# Patient Record
Sex: Male | Born: 1935 | Race: White | Hispanic: No | Marital: Married | Smoking: Never smoker
Health system: Southern US, Community
[De-identification: ages and names within clinical notes are randomized; demographics above are authoritative.]

## PROBLEM LIST (undated history)

## (undated) DIAGNOSIS — Z87442 Personal history of urinary calculi: Secondary | ICD-10-CM

## (undated) DIAGNOSIS — E785 Hyperlipidemia, unspecified: Secondary | ICD-10-CM

## (undated) DIAGNOSIS — H409 Unspecified glaucoma: Secondary | ICD-10-CM

## (undated) DIAGNOSIS — N402 Nodular prostate without lower urinary tract symptoms: Secondary | ICD-10-CM

## (undated) DIAGNOSIS — F0150 Vascular dementia without behavioral disturbance: Secondary | ICD-10-CM

## (undated) DIAGNOSIS — N400 Enlarged prostate without lower urinary tract symptoms: Secondary | ICD-10-CM

---

## 2007-09-22 ENCOUNTER — Ambulatory Visit: Payer: Self-pay | Admitting: Internal Medicine

## 2010-10-30 ENCOUNTER — Inpatient Hospital Stay (HOSPITAL_COMMUNITY)
Admission: EM | Admit: 2010-10-30 | Discharge: 2010-11-01 | Payer: Self-pay | Source: Home / Self Care | Attending: Internal Medicine | Admitting: Internal Medicine

## 2010-10-31 LAB — COMPREHENSIVE METABOLIC PANEL
ALT: 23 U/L (ref 0–53)
AST: 48 U/L — ABNORMAL HIGH (ref 0–37)
Albumin: 3.6 g/dL (ref 3.5–5.2)
Alkaline Phosphatase: 81 U/L (ref 39–117)
BUN: 25 mg/dL — ABNORMAL HIGH (ref 6–23)
CO2: 24 mEq/L (ref 19–32)
Calcium: 8.8 mg/dL (ref 8.4–10.5)
Chloride: 104 mEq/L (ref 96–112)
Creatinine, Ser: 1.27 mg/dL (ref 0.4–1.5)
GFR calc Af Amer: >60 mL/min (ref >60)
GFR calc non Af Amer: 54 mL/min — ABNORMAL LOW (ref >60)
Glucose, Bld: 122 mg/dL — ABNORMAL HIGH (ref 70–99)
Potassium: 4 mEq/L (ref 3.5–5.1)
Sodium: 136 mEq/L (ref 135–145)
Total Bilirubin: 0.9 mg/dL (ref 0.3–1.2)
Total Protein: 6.5 g/dL (ref 6.0–8.3)

## 2010-10-31 LAB — DIFFERENTIAL
Basophils Absolute: 0 10*3/uL (ref 0.0–0.1)
Basophils Relative: 0 % (ref 0–1)
Eosinophils Absolute: 0 10*3/uL (ref 0.0–0.7)
Eosinophils Relative: 0 % (ref 0–5)
Lymphocytes Relative: 11 % — ABNORMAL LOW (ref 12–46)
Lymphs Abs: 1.4 10*3/uL (ref 0.7–4.0)
Monocytes Absolute: 0.8 10*3/uL (ref 0.1–1.0)
Monocytes Relative: 6 % (ref 3–12)
Neutro Abs: 10.5 10*3/uL — ABNORMAL HIGH (ref 1.7–7.7)
Neutrophils Relative %: 83 % — ABNORMAL HIGH (ref 43–77)

## 2010-10-31 LAB — MAGNESIUM: Magnesium: 2.1 mg/dL (ref 1.5–2.5)

## 2010-10-31 LAB — CBC
HCT: 39.6 % (ref 39.0–52.0)
Hemoglobin: 13.2 g/dL (ref 13.0–17.0)
MCH: 30.1 pg (ref 26.0–34.0)
MCHC: 33.3 g/dL (ref 30.0–36.0)
MCV: 90.4 fL (ref 78.0–100.0)
Platelets: 189 10*3/uL (ref 150–400)
RBC: 4.38 MIL/uL (ref 4.22–5.81)
RDW: 13.6 % (ref 11.5–15.5)
WBC: 12.7 10*3/uL — ABNORMAL HIGH (ref 4.0–10.5)

## 2010-10-31 LAB — PHOSPHORUS: Phosphorus: 3.2 mg/dL (ref 2.3–4.6)

## 2010-10-31 LAB — MRSA PCR SCREENING: MRSA by PCR: NEGATIVE

## 2010-10-31 LAB — TSH: TSH: 2.147 u[IU]/mL (ref 0.350–4.500)

## 2010-11-01 LAB — CBC
HCT: 36 % — ABNORMAL LOW (ref 39.0–52.0)
Hemoglobin: 11.9 g/dL — ABNORMAL LOW (ref 13.0–17.0)
MCH: 29.8 pg (ref 26.0–34.0)
MCHC: 33.1 g/dL (ref 30.0–36.0)
MCV: 90.2 fL (ref 78.0–100.0)
Platelets: 183 10*3/uL (ref 150–400)
RBC: 3.99 MIL/uL — ABNORMAL LOW (ref 4.22–5.81)
RDW: 13.5 % (ref 11.5–15.5)
WBC: 7.2 10*3/uL (ref 4.0–10.5)

## 2010-11-01 LAB — COMPREHENSIVE METABOLIC PANEL
ALT: 22 U/L (ref 0–53)
AST: 40 U/L — ABNORMAL HIGH (ref 0–37)
Albumin: 2.9 g/dL — ABNORMAL LOW (ref 3.5–5.2)
Alkaline Phosphatase: 74 U/L (ref 39–117)
BUN: 11 mg/dL (ref 6–23)
CO2: 24 mEq/L (ref 19–32)
Calcium: 8.2 mg/dL — ABNORMAL LOW (ref 8.4–10.5)
Chloride: 109 mEq/L (ref 96–112)
Creatinine, Ser: 1.11 mg/dL (ref 0.4–1.5)
GFR calc Af Amer: >60 mL/min (ref >60)
GFR calc non Af Amer: >60 mL/min (ref >60)
Glucose, Bld: 91 mg/dL (ref 70–99)
Potassium: 3.4 mEq/L — ABNORMAL LOW (ref 3.5–5.1)
Sodium: 140 mEq/L (ref 135–145)
Total Bilirubin: 0.8 mg/dL (ref 0.3–1.2)
Total Protein: 5.9 g/dL — ABNORMAL LOW (ref 6.0–8.3)

## 2010-11-01 LAB — GLUCOSE, CAPILLARY
Glucose-Capillary: 116 mg/dL — ABNORMAL HIGH (ref 70–99)
Glucose-Capillary: 99 mg/dL (ref 70–99)
Glucose-Capillary: 139 mg/dL — ABNORMAL HIGH (ref 70–99)

## 2010-11-22 LAB — INFLUENZA PANEL BY PCR (TYPE A & B)
H1N1 flu by pcr: NOT DETECTED
Influenza A By PCR: NEGATIVE
Influenza B By PCR: NEGATIVE

## 2010-11-24 NOTE — H&P (Signed)
NAME:  Jesse Fitzgerald, MOM NO.:  [id number]  MEDICAL RECORD NO.:  [id number]          PATIENT TYPE:  EMS  LOCATION:  [facility]                         FACILITY:  [facility]  PHYSICIAN:  Michiel Cowboy, MDDATE OF BIRTH:  1920/01/23  DATE OF ADMISSION:  10/30/2010 DATE OF DISCHARGE:                             HISTORY & PHYSICAL   PRIMARY CARE DOCTOR:  Dr. Judithann Sheen of [place], [place].  CHIEF COMPLAINT:  Generalized fatigue.  The patient is an 77 year old gentleman with history of hypertension and early dementia and the patient lives with his wife.  Yesterday he was noted to be kind of weak and had a hard time getting out of his chair which is not usual.  His wife called EMS who presented and found him to have normal vital signs.  His symptoms spontaneously improved in a few hours and he was doing better but this morning he had a hard time getting out of bed because he was just so generally weak and not feeling well and altogether throughout the day he was feeling poorly, although could not quite put a finger on it, what exactly was bothering him at which point they brought him to the emergency department.  In ED, first he got a chest x-ray and a CT scan both of which were unremarkable. Then throughout the course of his stay in the ED he became febrile up to 103.6 and his blood pressure started to drop from 120/56 down to 111/70s.  He did appear to be somewhat dehydrated at which point [facility] was called for admission.  The patient denies any chest pain or shortness of breath although he is breathing somewhat rapidly.  His grandson explained to me that he is very anxious and does not like doctors and usually if nobody is in the room he actually behaves much more comfortably.  The patient denies any other complaints.  He denies any pain.  He denies any nausea or vomiting.  He has been having regular bowel movements.  His wife had been sick last week  with maybe a cold and maybe low-grade fever but no high fevers.  Nobody else is ill around him.  He has no pain that he can recall.  Otherwise, review of systems is negative.  PAST MEDICAL HISTORY:  Significant for hypertension and dementia.  FAMILY HISTORY:  Noncontributory.  SOCIAL HISTORY:  The patient does not smoke or drink or abuse drugs.  ALLERGIES:  MAYBE TO STREPTOMYCIN BUT THEY CANNOT TELL FOR SURE.  MEDICATIONS: 1. Aspirin 81 mg daily. 2. Benazepril/hydrochlorothiazide 20/12.5 mg daily. 3. Multivitamins. 4. He takes multiple supplements.  VITALS: Temperature T-max of 103.6, blood pressure 120/56 initially now down to 111/70s, pulse 105, respirations 16, satting 95% on 2 liters. The patient usually not on oxygen.  They could not get a good reading on room air. The patient appears to be in no acute distress. HEAD: Nontraumatic.  Somewhat dryish mucous membranes. LUNGS: Clear breath sounds bilaterally.  No crackles or wheezes could be appreciated. ABDOMEN:Slightly distended but nontender.  Normal bowel sounds present. HEART: Regular rate and rhythm.  Somewhat rapid though I could not  ascertain any murmurs. LOWER EXTREMITIES:Without clubbing, cyanosis or edema. SKIN: Clean, dry and intact.  Somewhat warm and flushed.  LABORATORY DATA:  White blood cell count 16.1, hemoglobin 13.7, sodium 134, potassium 4.4, creatinine 1.47, I am not sure what his baseline is. LFTs within normal limits.  UA unremarkable.  No evidence of infection. Chest x-ray showing very low lung volumes but otherwise unremarkable. CT scan of head unremarkable.  EKG showing right bundle branch block.  I do not have an old EKG on him unfortunately.  ASSESSMENT AND PLAN:  This is an 77 year old male with fever and possible early sepsis.  Will admit to step-down given down trending blood pressures and elevated fever.  At this point the etiology is unclear.  Will put on wide spectrum antibiotics with  vancomycin and Zosyn until we can clarify.  Will make sure we get blood cultures and urine culture.  Give IV fluids.  Follow up lactic acid.  Hypertension.  For now hold benazepril and hydrochlorothiazide.  Right bundle branch block.  Will repeat EKG in the morning.  He is at this point denying any chest pain and has not had any complaints about chest pain.  Elevated creatinine.  The patient does appear to be slightly dehydrated. Will give IV fluids and follow as needed.  May need to be renal ultrasound at this point but hold in case he is obstructed.  Prophylaxis.  Protonix and Lovenox.  Abdominal distention.  Will at least get a KUB for now.  Code status.  At this point family wishes for him to be full code.     Michiel Cowboy, MD     AVD/MEDQ  D:  10/30/2010  T:  10/30/2010  Job:  [id number]  cc:   Dr. Judithann Sheen  Electronically Signed by Therisa Doyne MD on 11/24/2010 06:15:28 AM

## 2011-01-07 LAB — CBC
HCT: 40.7 % (ref 39.0–52.0)
Hemoglobin: 13.7 g/dL (ref 13.0–17.0)
MCH: 30.4 pg (ref 26.0–34.0)
MCHC: 33.7 g/dL (ref 30.0–36.0)
MCV: 90.4 fL (ref 78.0–100.0)
Platelets: 203 10*3/uL (ref 150–400)
RBC: 4.5 MIL/uL (ref 4.22–5.81)
RDW: 13.5 % (ref 11.5–15.5)
WBC: 16.1 10*3/uL — ABNORMAL HIGH (ref 4.0–10.5)

## 2011-01-07 LAB — COMPREHENSIVE METABOLIC PANEL
ALT: 14 U/L (ref 0–53)
AST: 34 U/L (ref 0–37)
Albumin: 4 g/dL (ref 3.5–5.2)
Alkaline Phosphatase: 88 U/L (ref 39–117)
BUN: 28 mg/dL — ABNORMAL HIGH (ref 6–23)
CO2: 24 mEq/L (ref 19–32)
Calcium: 9.3 mg/dL (ref 8.4–10.5)
Chloride: 99 mEq/L (ref 96–112)
Creatinine, Ser: 1.47 mg/dL (ref 0.4–1.5)
GFR calc Af Amer: 55 mL/min — ABNORMAL LOW (ref >60)
GFR calc non Af Amer: 45 mL/min — ABNORMAL LOW (ref >60)
Glucose, Bld: 102 mg/dL — ABNORMAL HIGH (ref 70–99)
Potassium: 4.4 mEq/L (ref 3.5–5.1)
Sodium: 134 mEq/L — ABNORMAL LOW (ref 135–145)
Total Bilirubin: 0.7 mg/dL (ref 0.3–1.2)
Total Protein: 7.4 g/dL (ref 6.0–8.3)

## 2011-01-07 LAB — URINE CULTURE
Colony Count: NO GROWTH
Culture  Setup Time: 201201032210
Culture: NO GROWTH

## 2011-01-07 LAB — CULTURE, BLOOD (ROUTINE X 2)
Culture  Setup Time: 201201040559
Culture  Setup Time: 201201040559
Culture: NO GROWTH
Culture: NO GROWTH

## 2011-01-07 LAB — DIFFERENTIAL
Lymphs Abs: 1.9 10*3/uL (ref 0.7–4.0)
Monocytes Absolute: 1.2 10*3/uL — ABNORMAL HIGH (ref 0.1–1.0)
Monocytes Relative: 7 % (ref 3–12)
Neutro Abs: 13 10*3/uL — ABNORMAL HIGH (ref 1.7–7.7)
Neutrophils Relative %: 81 % — ABNORMAL HIGH (ref 43–77)

## 2011-01-07 LAB — URINALYSIS, ROUTINE W REFLEX MICROSCOPIC
Bilirubin Urine: NEGATIVE
Glucose, UA: NEGATIVE mg/dL
Hgb urine dipstick: NEGATIVE
Ketones, ur: NEGATIVE mg/dL
Nitrite: NEGATIVE
Protein, ur: NEGATIVE mg/dL
Specific Gravity, Urine: 1.023 (ref 1.005–1.030)
Urobilinogen, UA: 0.2 mg/dL (ref 0.0–1.0)
pH: 5.5 (ref 5.0–8.0)

## 2011-01-07 LAB — URINE MICROSCOPIC-ADD ON

## 2016-12-23 ENCOUNTER — Emergency Department: Payer: Medicare Other

## 2016-12-23 ENCOUNTER — Emergency Department
Admission: EM | Admit: 2016-12-23 | Discharge: 2016-12-26 | Disposition: E | Payer: Medicare Other | Attending: Emergency Medicine | Admitting: Emergency Medicine

## 2016-12-23 ENCOUNTER — Encounter: Payer: Self-pay | Admitting: Emergency Medicine

## 2016-12-23 DIAGNOSIS — A419 Sepsis, unspecified organism: Secondary | ICD-10-CM

## 2016-12-23 DIAGNOSIS — I214 Non-ST elevation (NSTEMI) myocardial infarction: Secondary | ICD-10-CM | POA: Diagnosis not present

## 2016-12-23 DIAGNOSIS — N390 Urinary tract infection, site not specified: Secondary | ICD-10-CM | POA: Insufficient documentation

## 2016-12-23 DIAGNOSIS — J189 Pneumonia, unspecified organism: Secondary | ICD-10-CM | POA: Insufficient documentation

## 2016-12-23 DIAGNOSIS — R740 Nonspecific elevation of levels of transaminase and lactic acid dehydrogenase [LDH]: Secondary | ICD-10-CM | POA: Insufficient documentation

## 2016-12-23 DIAGNOSIS — J9601 Acute respiratory failure with hypoxia: Secondary | ICD-10-CM | POA: Insufficient documentation

## 2016-12-23 DIAGNOSIS — R7989 Other specified abnormal findings of blood chemistry: Secondary | ICD-10-CM

## 2016-12-23 DIAGNOSIS — R319 Hematuria, unspecified: Secondary | ICD-10-CM

## 2016-12-23 DIAGNOSIS — Z79899 Other long term (current) drug therapy: Secondary | ICD-10-CM | POA: Diagnosis not present

## 2016-12-23 DIAGNOSIS — R0602 Shortness of breath: Secondary | ICD-10-CM | POA: Diagnosis present

## 2016-12-23 HISTORY — DX: Benign prostatic hyperplasia without lower urinary tract symptoms: N40.0

## 2016-12-23 HISTORY — DX: Unspecified glaucoma: H40.9

## 2016-12-23 HISTORY — DX: Vascular dementia, unspecified severity, without behavioral disturbance, psychotic disturbance, mood disturbance, and anxiety: F01.50

## 2016-12-23 HISTORY — DX: Nodular prostate without lower urinary tract symptoms: N40.2

## 2016-12-23 HISTORY — DX: Personal history of urinary calculi: Z87.442

## 2016-12-23 HISTORY — DX: Hyperlipidemia, unspecified: E78.5

## 2016-12-23 LAB — CBC WITH DIFFERENTIAL/PLATELET
BASOS ABS: 0 10*3/uL (ref 0–0.1)
BASOS PCT: 0 %
EOS PCT: 0 %
Eosinophils Absolute: 0 10*3/uL (ref 0–0.7)
HEMATOCRIT: 42.5 % (ref 40.0–52.0)
Hemoglobin: 14.3 g/dL (ref 13.0–18.0)
Lymphocytes Relative: 15 %
Lymphs Abs: 2.3 10*3/uL (ref 1.0–3.6)
MCH: 30.9 pg (ref 26.0–34.0)
MCHC: 33.7 g/dL (ref 32.0–36.0)
MCV: 91.8 fL (ref 80.0–100.0)
MONO ABS: 1.5 10*3/uL — AB (ref 0.2–1.0)
Monocytes Relative: 10 %
NEUTROS ABS: 11.3 10*3/uL — AB (ref 1.4–6.5)
Neutrophils Relative %: 75 %
PLATELETS: 278 10*3/uL (ref 150–440)
RBC: 4.63 MIL/uL (ref 4.40–5.90)
RDW: 14.1 % (ref 11.5–14.5)
WBC: 15.1 10*3/uL — ABNORMAL HIGH (ref 3.8–10.6)

## 2016-12-23 LAB — COMPREHENSIVE METABOLIC PANEL
ALK PHOS: 98 U/L (ref 38–126)
ALT: 92 U/L — ABNORMAL HIGH (ref 17–63)
ANION GAP: 15 (ref 5–15)
AST: 131 U/L — ABNORMAL HIGH (ref 15–41)
Albumin: 3.9 g/dL (ref 3.5–5.0)
BUN: 46 mg/dL — ABNORMAL HIGH (ref 6–20)
CALCIUM: 9.3 mg/dL (ref 8.9–10.3)
CHLORIDE: 103 mmol/L (ref 101–111)
CO2: 19 mmol/L — AB (ref 22–32)
Creatinine, Ser: 1.54 mg/dL — ABNORMAL HIGH (ref 0.61–1.24)
GFR calc non Af Amer: 37 mL/min — ABNORMAL LOW (ref >60)
GFR, EST AFRICAN AMERICAN: 43 mL/min — AB (ref >60)
Glucose, Bld: 124 mg/dL — ABNORMAL HIGH (ref 65–99)
Potassium: 3.7 mmol/L (ref 3.5–5.1)
SODIUM: 137 mmol/L (ref 135–145)
Total Bilirubin: 1.7 mg/dL — ABNORMAL HIGH (ref 0.3–1.2)
Total Protein: 7.7 g/dL (ref 6.5–8.1)

## 2016-12-23 LAB — URINALYSIS, COMPLETE (UACMP) WITH MICROSCOPIC
Bacteria, UA: NONE SEEN
Bilirubin Urine: NEGATIVE
Glucose, UA: NEGATIVE mg/dL
Ketones, ur: NEGATIVE mg/dL
Nitrite: NEGATIVE
Protein, ur: 100 mg/dL — AB
SPECIFIC GRAVITY, URINE: 1.023 (ref 1.005–1.030)
pH: 5 (ref 5.0–8.0)

## 2016-12-23 LAB — BLOOD GAS, ARTERIAL
ACID-BASE DEFICIT: 5.6 mmol/L — AB (ref 0.0–2.0)
ALLENS TEST (PASS/FAIL): POSITIVE — AB
BICARBONATE: 15.2 mmol/L — AB (ref 20.0–28.0)
Delivery systems: POSITIVE
Expiratory PAP: 5
FIO2: 0.4
Inspiratory PAP: 10
Mechanical Rate: 8
O2 SAT: 99 %
PATIENT TEMPERATURE: 37
PO2 ART: 123 mmHg — AB (ref 83.0–108.0)
pCO2 arterial: 20 mmHg — ABNORMAL LOW (ref 32.0–48.0)
pH, Arterial: 7.49 — ABNORMAL HIGH (ref 7.350–7.450)

## 2016-12-23 LAB — PROCALCITONIN: PROCALCITONIN: 0.59 ng/mL

## 2016-12-23 LAB — BRAIN NATRIURETIC PEPTIDE: B NATRIURETIC PEPTIDE 5: 3436 pg/mL — AB (ref 0.0–100.0)

## 2016-12-23 LAB — PROTIME-INR
INR: 1.2
PROTHROMBIN TIME: 15.3 s — AB (ref 11.4–15.2)

## 2016-12-23 LAB — LACTIC ACID, PLASMA: LACTIC ACID, VENOUS: 4.4 mmol/L — AB (ref 0.5–1.9)

## 2016-12-23 LAB — TROPONIN I: TROPONIN I: 19.31 ng/mL — AB (ref <0.03)

## 2016-12-23 LAB — LIPASE, BLOOD: Lipase: 20 U/L (ref 11–51)

## 2016-12-23 MED ORDER — DEXTROSE 5 % IV SOLN
2.0000 g | INTRAVENOUS | Status: DC
  Filled 2016-12-23: qty 2

## 2016-12-23 MED ORDER — DEXTROSE 5 % IV SOLN
500.0000 mg | Freq: Once | INTRAVENOUS | Status: AC
  Administered 2016-12-23: 500 mg via INTRAVENOUS
  Filled 2016-12-23: qty 500

## 2016-12-23 MED ORDER — DEXTROSE 5 % IV SOLN: 1.0000 g | Freq: Once | INTRAVENOUS | Status: DC

## 2016-12-23 MED ORDER — SODIUM CHLORIDE 0.9 % IV BOLUS (SEPSIS)
1000.0000 mL | Freq: Once | INTRAVENOUS | Status: AC
  Administered 2016-12-23: 1000 mL via INTRAVENOUS

## 2016-12-23 MED ORDER — HEPARIN SODIUM (PORCINE) 5000 UNIT/ML IJ SOLN
4000.0000 [IU] | Freq: Once | INTRAMUSCULAR | Status: AC
  Administered 2016-12-23: 4000 [IU] via INTRAVENOUS
  Filled 2016-12-23: qty 1

## 2016-12-23 MED ORDER — AZITHROMYCIN 500 MG IV SOLR: 500.0000 mg | INTRAVENOUS | Status: DC

## 2016-12-23 MED ORDER — ASPIRIN 81 MG PO CHEW: 324.0000 mg | CHEWABLE_TABLET | Freq: Once | ORAL | Status: DC

## 2016-12-23 MED ORDER — HEPARIN (PORCINE) IN NACL 100-0.45 UNIT/ML-% IJ SOLN
900.0000 [IU]/h | INTRAMUSCULAR | Status: DC
  Administered 2016-12-23: 900 [IU]/h via INTRAVENOUS
  Filled 2016-12-23: qty 250

## 2016-12-23 MED ORDER — SODIUM CHLORIDE 0.9 % IV BOLUS (SEPSIS): 250.0000 mL | Freq: Once | INTRAVENOUS | Status: DC

## 2016-12-23 MED ORDER — CEFTRIAXONE SODIUM-DEXTROSE 1-3.74 GM-% IV SOLR
1.0000 g | Freq: Once | INTRAVENOUS | Status: AC
  Administered 2016-12-23: 1 g via INTRAVENOUS
  Filled 2016-12-23: qty 50

## 2016-12-23 MED ORDER — NOREPINEPHRINE BITARTRATE 1 MG/ML IV SOLN
0.0000 ug/min | Freq: Once | INTRAVENOUS | Status: AC
  Administered 2016-12-23: 5 ug/min via INTRAVENOUS
  Filled 2016-12-23: qty 4

## 2016-12-23 MED ORDER — SODIUM CHLORIDE 0.9 % IV BOLUS (SEPSIS): 1000.0000 mL | Freq: Once | INTRAVENOUS | Status: DC

## 2016-12-23 MED ORDER — ASPIRIN 300 MG RE SUPP
300.0000 mg | Freq: Once | RECTAL | Status: AC
  Administered 2016-12-23: 300 mg via RECTAL
  Filled 2016-12-23: qty 1

## 2016-12-23 MED FILL — Medication: Qty: 1 | Status: AC

## 2016-12-24 LAB — URINE CULTURE: Culture: NO GROWTH

## 2016-12-26 DIAGNOSIS — 419620001 Death: Secondary | SNOMED CT | POA: Insufficient documentation

## 2016-12-26 NOTE — ED Notes (Signed)
Family to nursing station, stating that they were leaving.

## 2016-12-26 NOTE — ED Provider Notes (Signed)
[facility] Emergency Department Provider Note  ____________________________________________   First MD Initiated Contact with Patient 12-27-2016 1240     (approximate)  I have reviewed the triage vital signs and the nursing notes.   HISTORY  Chief Complaint Shortness of Breath  Level 5 caveat:  history/ROS limited by acute/critical illness  HPI Jesse Fitzgerald is a 83 y.o. male with no history of lung disease documented in the EMR (PCP is Dr. Judithann Sheen) who presents by EMS in severe respiratory distress.   History was limited, but apparently he has been having increased shortness of breath over the last few days or possibly a week.  First responders to the 911 call told paramedics that his oxygen saturation was in the low 80s and they started him on a nonrebreather.  In route to the hospital he received 3 DuoNeb labs and Solu-Medrol 125 mg IV and the patient states he is breathing better, but he has tachypnea in the 50s and is using accessory muscles upon arrival.  He is alert and responsive to my questions.  He denies chest pain.  He also denies feeling bad although he does admit that he has some respiratory difficulties.  Past Medical History:  Diagnosis Date  . BPH (benign prostatic hyperplasia)   . Glaucoma   . History of nephrolithiasis   . Hyperlipidemia   . Prostate nodule   . Vascular dementia     There are no active problems to display for this patient.   History reviewed. No pertinent surgical history.  Prior to Admission medications   Medication Sig Start Date End Date Taking? Authorizing Provider  Cetirizine HCl (ZYRTEC ALLERGY PO) Take 1 tablet by mouth daily.   Yes Historical Provider, MD  guaiFENesin (MUCINEX) 600 MG 12 hr tablet Take 600 mg by mouth daily.   Yes Historical Provider, MD  Multiple Vitamin (MULTIVITAMIN) tablet Take 1 tablet by mouth daily.   Yes Historical Provider, MD    Allergies Patient has no known  allergies.  Family History  Problem Relation Age of Onset  . Family history unknown: Yes    Social History Social History  Substance Use Topics  . Smoking status: Never Smoker  . Smokeless tobacco: Never Used  . Alcohol use No    Review of Systems Level 5 caveat:  history/ROS limited by acute/critical illness as well as chronic vascular dementia  ____________________________________________   PHYSICAL EXAM:  ED Triage Vitals  Enc Vitals Group     BP 12-27-16 1248 99/70     Pulse Rate 12/27/2016 1248 (!) 120     Resp Dec 27, 2016 1248 (!) 38     Temp Dec 27, 2016 1248 97 F (36.1 C)     Temp Source 12-27-2016 1248 Oral     SpO2 2016/12/27 1248 (!) 70 %     Weight 12/27/2016 1249 160 lb (72.6 kg)     Height 12/27/2016 1249 5\' 6"  (1.676 m)     Head Circumference --      Peak Flow --      Pain Score --      Pain Loc --      Pain Edu? --      Excl. in GC? --      Constitutional: Alert, in severe respiratory distress with accessory muscle usage and tachypnea Eyes: Conjunctivae are normal. PERRL. EOMI. Head: Atraumatic. Nose: No congestion/rhinnorhea. Mouth/Throat: Mucous membranes are moist. Neck: No stridor.  No meningeal signs.   Cardiovascular: Sinus tachycardia, regular rhythm. Grossly normal  heart sounds. Respiratory: Increased respiratory effort with retractions and intercostal muscle usage.  Coarse breath sounds are present bilaterally but most notable on the right.  There is some wheezing but it is minimal in the expiratory phase. Gastrointestinal: Soft and nontender. No distention.  Musculoskeletal: No lower extremity tenderness nor edema. No gross deformities of extremities. Neurologic:  Normal speech and language. No gross focal neurologic deficits are appreciated.  Skin:  Skin is cool, clammy and intact. No rash noted.   ____________________________________________   LABS (all labs ordered are listed, but only abnormal results are displayed)  Labs Reviewed  BLOOD  GAS, ARTERIAL - Abnormal; Notable for the following:       Result Value   pH, Arterial 7.49 (*)    pCO2 arterial 20 (*)    pO2, Arterial 123 (*)    Bicarbonate 15.2 (*)    Acid-base deficit 5.6 (*)    Allens test (pass/fail) POSITIVE (*)    All other components within normal limits  LACTIC ACID, PLASMA - Abnormal; Notable for the following:    Lactic Acid, Venous 4.4 (*)    All other components within normal limits  COMPREHENSIVE METABOLIC PANEL - Abnormal; Notable for the following:    CO2 19 (*)    Glucose, Bld 124 (*)    BUN 46 (*)    Creatinine, Ser 1.54 (*)    AST 131 (*)    ALT 92 (*)    Total Bilirubin 1.7 (*)    GFR calc non Af Amer 37 (*)    GFR calc Af Amer 43 (*)    All other components within normal limits  BRAIN NATRIURETIC PEPTIDE - Abnormal; Notable for the following:    B Natriuretic Peptide 3,436.0 (*)    All other components within normal limits  TROPONIN I - Abnormal; Notable for the following:    Troponin I 19.31 (*)    All other components within normal limits  CBC WITH DIFFERENTIAL/PLATELET - Abnormal; Notable for the following:    WBC 15.1 (*)    Neutro Abs 11.3 (*)    Monocytes Absolute 1.5 (*)    All other components within normal limits  PROTIME-INR - Abnormal; Notable for the following:    Prothrombin Time 15.3 (*)    All other components within normal limits  URINALYSIS, COMPLETE (UACMP) WITH MICROSCOPIC - Abnormal; Notable for the following:    Color, Urine AMBER (*)    APPearance HAZY (*)    Hgb urine dipstick MODERATE (*)    Protein, ur 100 (*)    Leukocytes, UA LARGE (*)    Squamous Epithelial / LPF 0-5 (*)    All other components within normal limits  CULTURE, BLOOD (ROUTINE X 2)  CULTURE, BLOOD (ROUTINE X 2)  URINE CULTURE  LIPASE, BLOOD  PROCALCITONIN  LACTIC ACID, PLASMA  INFLUENZA PANEL BY PCR (TYPE A & B)  HEPARIN LEVEL (UNFRACTIONATED)  APTT   ____________________________________________  EKG  ED ECG REPORT I, Anaiyah Anglemyer,  Aasha Dina, the attending physician, personally viewed and interpreted this ECG.  Date: 12/27/2016 EKG Time: 12:51 PM Rate: 127 Rhythm: sinus tachycardia QRS Axis: normal Intervals: RBBB and LPFB ST/T Wave abnormalities: Profound ST segment changes most notable in leads 1, 3, aVF, aVR, V2, V3. Conduction Disturbances: none Narrative Interpretation: Does not meet STEMI criteria but likely represents acute ischemia in the setting of severe respiratory distress/failure  ____________________________________________  RADIOLOGY   Dg Chest Port 1 View  Result Date: 12/27/2016 CLINICAL DATA:  Respiratory failure,  difficulty breathing, shortness of Breath EXAM: PORTABLE CHEST 1 VIEW COMPARISON:  10/31/2010 FINDINGS: Diffuse airspace disease throughout the right lung, concerning for pneumonia. Left base atelectasis or infiltrate. Elevation of the left hemidiaphragm. Heart is normal size. Probable right effusion. IMPRESSION: Diffuse right lung airspace disease and left basilar opacity concerning for pneumonia. Small right effusion. Electronically Signed   By: Charlett Nose M.D.   On: 01/03/2017 13:23    ____________________________________________   PROCEDURES  Procedure(s) performed:   .Critical Care Performed by: Loleta Rose Authorized by: Loleta Rose   Critical care provider statement:    Critical care time (minutes):  75   Critical care time was exclusive of:  Separately billable procedures and treating other patients   Critical care was necessary to treat or prevent imminent or life-threatening deterioration of the following conditions:  Respiratory failure   Critical care was time spent personally by me on the following activities:  Development of treatment plan with patient or surrogate, discussions with consultants, evaluation of patient's response to treatment, examination of patient, obtaining history from patient or surrogate, ordering and performing treatments and interventions,  ordering and review of laboratory studies, ordering and review of radiographic studies, pulse oximetry, re-evaluation of patient's condition and review of old charts  Procedure Name: Intubation Date/Time: 2017/01/03 3:10 PM Performed by: Loleta Rose Pre-anesthesia Checklist: Emergency Drugs available Oxygen Delivery Method: Ambu bag Preoxygenation: Pre-oxygenation with 100% oxygen Ventilation: Mask ventilation without difficulty Laryngoscope Size: Glidescope and 4 Tube size: 7.5 mm Number of attempts: 1 Placement Confirmation: ETT inserted through vocal cords under direct vision,  CO2 detector and Breath sounds checked- equal and bilateral Secured at: 24 cm Tube secured with: Tape Dental Injury: Teeth and Oropharynx as per pre-operative assessment         Critical Care performed: Yes, see critical care procedure note(s) ____________________________________________   INITIAL IMPRESSION / ASSESSMENT AND PLAN / ED COURSE  Pertinent labs & imaging results that were available during my care of the patient were reviewed by me and considered in my medical decision making (see chart for details).  The patient appears critically ill upon arrival although it appears initially at least to be an issue of respiratory failure rather than sepsis or cardiac.  I am ordering all of the sepsis protocol but not calling a code sepsis at this point as he is not febrile and does not have tachycardia.  I have called respiratory therapy to put him on BiPAP and obtain an ABG.  He was on nonrebreather upon arrival and we took him off oxygen to transfer to the ED stretcher.  During that time we got a brief period of a good waveform on the pulse oximeter and he was at 70%.  I started him on NRB to transition to Bipap.  Family is going to arrive soon and hopefully should provide additional history.  Given possibility of  heart failure with the coarse breath sounds, will hope on IV fluids at this time.     Clinical Course as of Dec 23 1509  Mon Jan 03, 2017  1257 EKG highly concerning for acute ischemia but given the history of several days of worsening respiratory distress I suspect this is demand ischemia rather than a primary cardiac event.  The patient continues to have no chest pain and he is not currently stable for the cardiac catheterization lab.  We will treat his respiratory distress and obtain the rest of the workup first and I will obtain collateral information from the  family when they arrive.  [CF]  24 Spoke with daughter and son-in-law.  They confirm that the patient is generally quite healthy for his age and has had no history of cardiac or lung issues.  They confirmed that he has been increasingly short of breath particularly with exertion over the last 5 days.  His symptoms were very severe today so his daughter insisted that EMS was called.  Reportedly EMS was called at least once over the last couple of days but either the patient declined transport or, according to the daughter, the family was told that he did not need to be transported to the hospital.  Regardless he does appear acutely worse to them today.  We had a lengthy conversation about goals of care and the patient is full code and we agreed that if he does not improve or if he worsens on the BiPAP then we will proceed with intubation, CPR, and whatever else is necessary.  Patient remains alert, has tachypnea in the 40s, and his pulse oximeter is difficult to appreciate but ranges from the 80s to the 90s.  His ABG is surprisingly reassuring, most notable for a low PCO2 secondary to his tachypnea.  [CF]  1336 I reviewed the chest x-ray as well as the radiology report and the right lung strongly suggest pneumonia.  I have made the patient a code sepsis and ordered community acquired pneumonia antibiotics (ceftriaxone and azithromycin) as well as 30 mL/kg of IV fluids but we will give him an sequence given his age and my concern  for volume overload.  He is not hypertensive at this time.  Lactate is still pending.  I ordered an influenza PCR panel.  I updated the patient's daughter.  He remains alert but is still breathing 40-50 times a minute although he does look slightly more comfortable on BiPAP and he looked before.  His oxygen saturation seems to be about 96% at this time.  [CF]  1346 Lactate 4.4, Troponin >19.  Ordering heparin bolus and infusion, aspirin 300 mg PR, calling Dr. Kirke Corin who is on call in the cath lab to discuss.  Patient remains unstable.  Will update family after I speak with Dr. Kirke Corin.  [CF]  1355 Family stepped out, so I cannot update them yet.  Awaiting callback from Dr. Kirke Corin.  [CF]  1401 I spoke by phone with Dr. Kirke Corin who reviewed the EKG and agrees the patient likely had a cardiac event several days ago, but he does not meet STEMI criteria at this time.  He agrees with my plan for heparin and ASA 300 PR, and feels the patient needs to be stabilized prior to any cardiac intervention.  [CF]  1417 I updated the patient's daughter with the grave prognosis and multiple comorbid and critical conditions.  I explained that he likely will need to be intubated and the pros and cons associated with intubation vs continued bipap.  They have multiple family members on the way and she does not want to proceed with intubation unless it is absolutely necessary so that the family members could have a chance to talk with him.  I reassessed him and he says that he is feeling better, remains alert, and says that his breathing is more comfortable.  He still has tachypnea but it has improved.  The patient's daughter understands the situation and knows that I may need to proceed with emergent intubation but at this point we will hold off and I have paged the ICU to  discuss admission and management plans.  [CF]  1505 The patient had an acute event with his family present.  He went from talking to them too suddenly staring off in  space and was unresponsive.  I was called emergently to the room and when I ran in his pupils are were both equal and dilated and he was breathing but not responding to noxious stimuli including corneal reflex.  We immediately began to support his breathing, which was slowing rapidly, with BVM respirations and as we were setting up for intubation he lost a pulse.  He continued PEA arrest for 2 rounds of CPR and then we got back a pulse.  He was intubated with equal breath sounds bilaterally and color change during the initial CPR.  He lost pulses again but once again after brief PEA arrest we were able to get a spontaneous return of pulse.  I left the room at that point update the family.  I explained the grave prognosis and they confirmed that if he were to code again they would be comfortable with me not continuing CPR and calling the code.  When I return to the room he had a pulse and I started him on Levophed, but he began to brady down and then again lost a pulse.  At this point I called it due to repeated PEA arrest.  I updated the family after initially calling the death and they confirmed that they do not want to make the case a medical examiner case which I believe is appropriate given the lab results and the probability he had a cardiac event within the last 5 days.  We are preparing the body for viewing by the family and they have been updated all their questions answered.  [CF]    Clinical Course User Index [CF] Loleta Roseory Spencer Cardinal, MD    ____________________________________________  FINAL CLINICAL IMPRESSION(S) / ED DIAGNOSES  Final diagnoses:  Acute respiratory failure with hypoxemia (HCC)  NSTEMI (non-ST elevated myocardial infarction) (HCC)  Community acquired pneumonia of right lung, unspecified part of lung  Urinary tract infection with hematuria, site unspecified  Elevated lactic acid level  Sepsis, due to unspecified organism [facility](HCC)     MEDICATIONS GIVEN DURING THIS  VISIT:  Medications  sodium chloride 0.9 % bolus 1,000 mL (1,000 mLs Intravenous New Bag/Given 02/21/17 1351)    And  sodium chloride 0.9 % bolus 1,000 mL (not administered)    And  sodium chloride 0.9 % bolus 250 mL (not administered)  heparin ADULT infusion 100 units/mL (25000 units/22850mL sodium chloride 0.45%) (900 Units/hr Intravenous New Bag/Given 02/21/17 1433)  azithromycin (ZITHROMAX) 500 mg in dextrose 5 % 250 mL IVPB (not administered)  cefTRIAXone (ROCEPHIN) 2 g in dextrose 5 % 50 mL IVPB (not administered)  azithromycin (ZITHROMAX) 500 mg in dextrose 5 % 250 mL IVPB (500 mg Intravenous New Bag/Given 02/21/17 1402)  cefTRIAXone (ROCEPHIN) IVPB 1 g (1 g Intravenous Given 02/21/17 1351)  heparin injection 4,000 Units (4,000 Units Intravenous Given 02/21/17 1414)  aspirin suppository 300 mg (300 mg Rectal Given 02/21/17 1350)  norepinephrine (LEVOPHED) 4 mg in dextrose 5 % 250 mL (0.016 mg/mL) infusion (5 mcg/min Intravenous New Bag/Given 02/21/17 1456)     NEW OUTPATIENT MEDICATIONS STARTED DURING THIS VISIT:  New Prescriptions   No medications on file    Modified Medications   No medications on file    Discontinued Medications   No medications on file     Note:  This document was prepared  using Conservation officer, historic buildings and may include unintentional dictation errors.    Loleta Rose, MD 01/19/2017 [phone]

## 2016-12-26 NOTE — ED Notes (Signed)
Pts family given condolence tray

## 2016-12-26 NOTE — ED Notes (Signed)
Pts family and pts pastor in room, denies needs at this time

## 2016-12-26 NOTE — ED Notes (Signed)
Pt became unresponsive to verbal cues, MD made aware. Staff and MD reported to bedside promptly.

## 2016-12-26 NOTE — Progress Notes (Signed)
[place] responded to a page for the Pt to provide support for the family of a Pt who passed away in the Ed. Pt was alert when he arrived in the Ed, but his health rapidly deteriorated that he became unresponsive and then died. [place] met the family, provided the ministry of encourage and presence until the Pt's family pastor arrived. [place] informed the family that the [facility] was available if needed.    01/04/2017 1500  Clinical Encounter Type  Visited With Patient;Patient and family together  Visit Type Initial;Death  Referral From Nurse  Consult/Referral To Chaplain  Spiritual Encounters  Spiritual Needs Emotional;Grief support;Other (Comment)

## 2016-12-26 NOTE — Progress Notes (Signed)
ANTICOAGULATION CONSULT NOTE - Initial Consult  Pharmacy Consult for heparin dosing Indication: chest pain/ACS  No Known Allergies  Patient Measurements: Height: $RemoveBeforeD [id number]$5\' 6"lated) : 63.8 Heparin Dosing Weight: 72.6 kg  Vital Signs: Temp: 97 F (36.1 C) (02/26 1248) Temp Source: Oral (02/26 1248) BP: 99/70 (02/26 1248) Pulse Rate: 120 (02/26 1248)  Labs:  Recent Labs  Jul 01, 2017 1252  HGB 14.3  HCT 42.5  PLT 278  LABPROT 15.3*  INR 1.20  CREATININE 1.54*  TROPONINI 19.31*    Estimated Creatinine Clearance: 26.5 mL/min (by C-G formula based on SCr of 1.54 mg/dL (H)).   Medical History: Past Medical History:  Diagnosis Date  . BPH (benign prostatic hyperplasia)   . Glaucoma   . History of nephrolithiasis   . Hyperlipidemia   . Prostate nodule   . Vascular dementia     Medications:  Scheduled:  . cefTRIAXone  1 g Intravenous Once  . heparin  4,000 Units Intravenous Once    Assessment: Patient admitted for SOB and DOE w/ tachypnea. Patient was found to have trop of 19.3 Pharmacy has been consulted for heparin dosing.  Goal of Therapy:  Heparin level 0.3-0.7 units/ml Monitor platelets by anticoagulation protocol: Yes   Plan:  Patient was given heparin 4000 unit bolus x 1 in ED @ 1400. Will order stat aPTT, protime/INR already drawn. Will initiate heparin drip to start at 900 unit/hr (12 unit/kg/hr). Will check Anti-Xa 8 hours after start of infusion 2/26 @ 2200. Will continue to monitor H/H and plts.  Thank you for this consult.  Thomasene Rippleavid Deborahann Poteat, PharmD, BCPS Clinical Pharmacist October 29, 2016

## 2016-12-26 NOTE — ED Notes (Signed)
Orderly at bedside

## 2016-12-26 NOTE — ED Triage Notes (Signed)
Pt to ED from home c/o SOB. Per EMS pt with dyspnea upon exertion and at rest, tachypnic at the scene, EMS administered 125 mg of soulmedrol and 3 duonebs while in route.

## 2016-12-26 NOTE — ED Notes (Addendum)
Pulse not palpable at 1440 CPR started. 1st dose of epinephrine 1mg   administered at 1441, pulse back at 1443, CPR paused. Succinylcholine 100 mg administered at 1444, pt intubated 24 at the teeth. Pulse not palpable at 1444, cpr resumed. 2nd dose of epinephrine administered at 1445. Pulse palpable at 1448.

## 2016-12-26 NOTE — ED Notes (Signed)
At 1456 pulse not palpable. MD spoke to family, and they decided not to continue with resuscitative measures. Time of death [id number].

## 2016-12-26 NOTE — Progress Notes (Signed)
Pharmacy Antibiotic Note  Jesse Fitzgerald is a 83 y.o. male admitted on 2017-08-14 with pneumonia.  Pharmacy has been consulted for azithromycin/ceftriaxone dosing.  Plan: Patient was given Azithromycin 500 mg x 1 IV and Rocephin 1g x 1 IV in the ED.  Will start Azithromycin 500 mg daily and Ceftriaxone 2g daily starting tomorrow 2/27. Patient meets 3/4 SIRs criteria and was found to have basilar opacities on CXR. Will monitor resolution of signs/symptoms of infection.  Height: 5\' 6"  (167.6 cm) Weight: 160 lb (72.6 kg) IBW/kg (Calculated) : 63.8  Temp (24hrs), Avg:97 F (36.1 C), Min:97 F (36.1 C), Max:97 F (36.1 C)   Recent Labs Lab 2017/07/18 1252  WBC 15.1*  CREATININE 1.54*  LATICACIDVEN 4.4*    Estimated Creatinine Clearance: 26.5 mL/min (by C-G formula based on SCr of 1.54 mg/dL (H)).    No Known Allergies  Antimicrobials this admission: 2/26 Azithro >>  2/26 CTX >>   Dose adjustments this admission: CTX 2g daily  Microbiology results: 2/26 BCx: sent  Thank you for allowing pharmacy to be a part of this patient's care.  Thomasene Rippleavid Adel Burch, PharmD, BCPS Clinical Pharmacist 2017-08-14

## 2016-12-26 NOTE — ED Notes (Addendum)
Unadorned yellow band from pts R hand placed in specimen cup w/ pt label and placed in body bag with pt.  Ann, Database administratorhouse supervisor, and orderly informed

## 2016-12-26 DEATH — deceased

## 2016-12-28 LAB — CULTURE, BLOOD (ROUTINE X 2)
Culture: NO GROWTH
Culture: NO GROWTH

## 2017-11-16 IMAGING — DX DG CHEST 1V PORT
1 series · 1 of 1 positions shown · non-contrast
Comparison: 10/31/2010

CLINICAL DATA: Respiratory failure, difficulty breathing, shortness
of Breath

EXAM:
PORTABLE CHEST 1 VIEW

[chest ap]
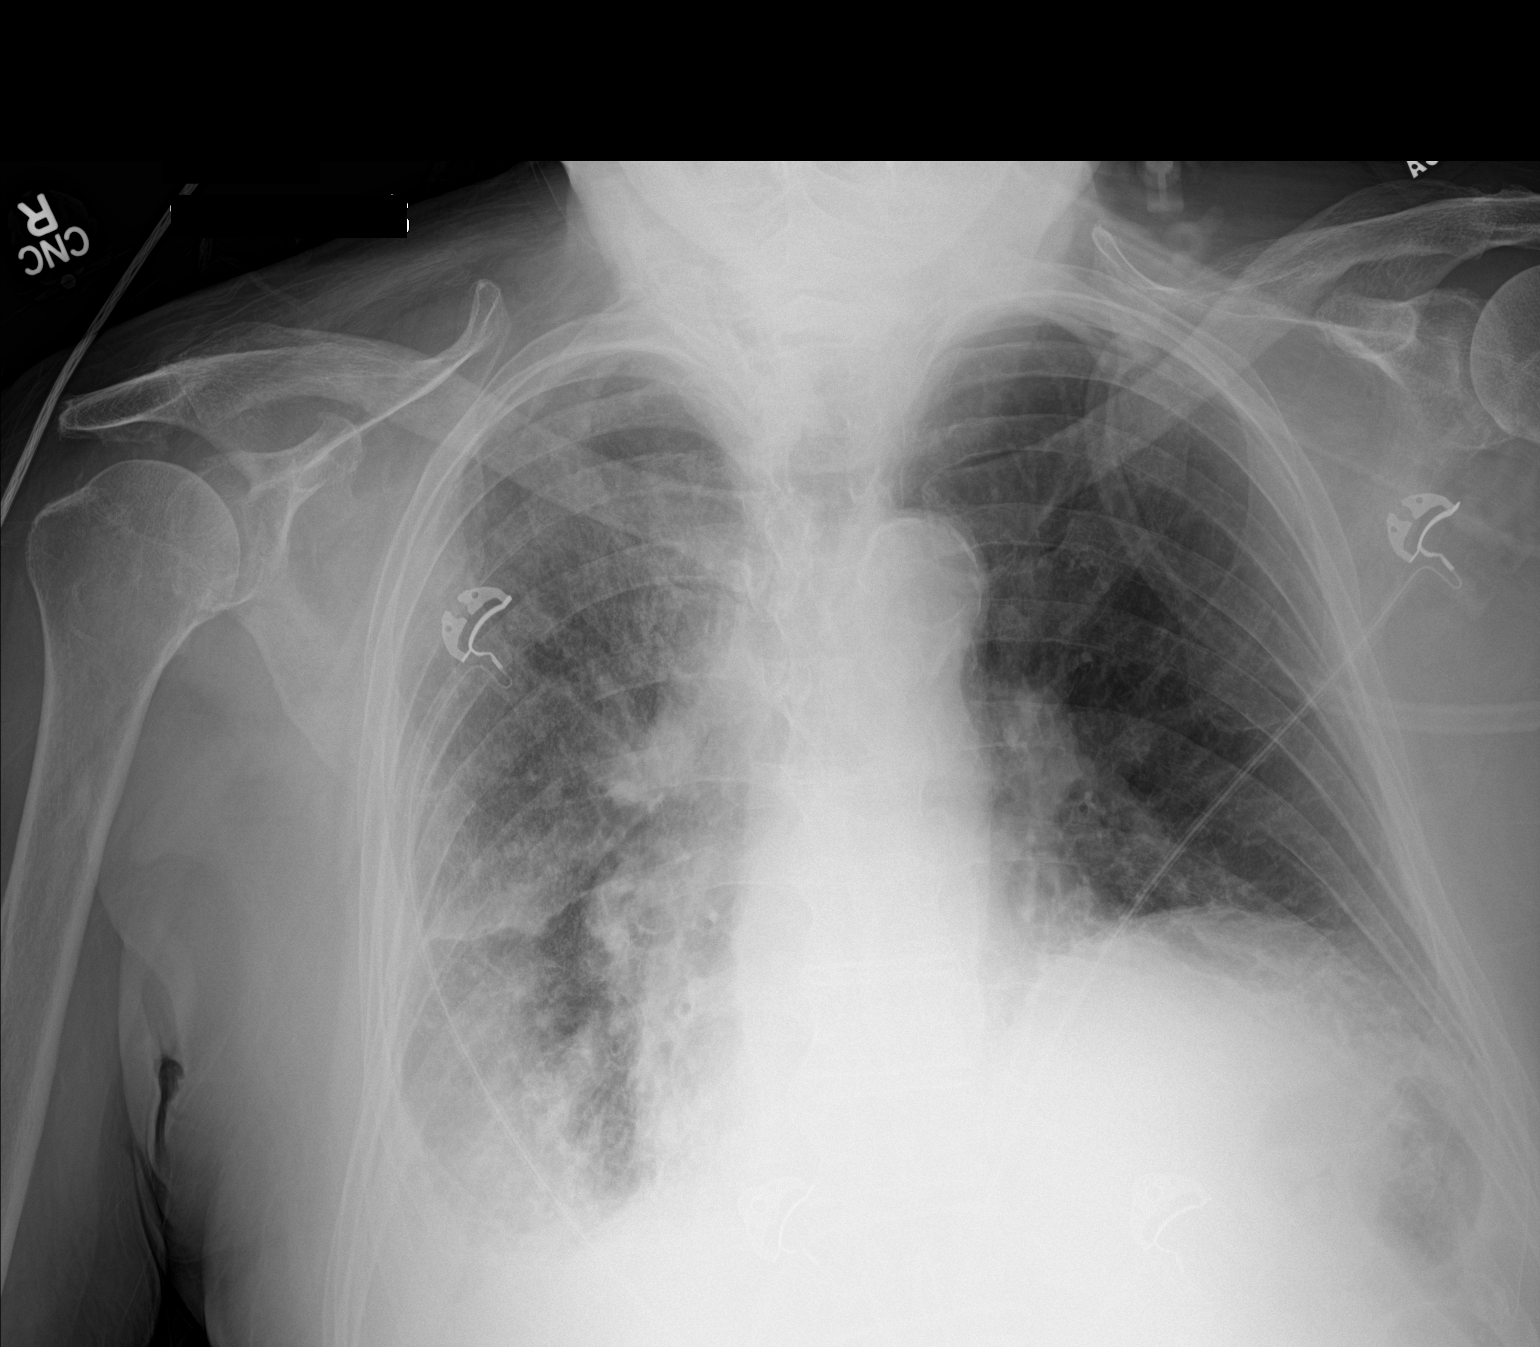

[1 of 1 positions shown; findings below may reference images not displayed]

FINDINGS: Diffuse airspace disease throughout the right lung, concerning for
pneumonia. Left base atelectasis or infiltrate. Elevation of the
left hemidiaphragm. Heart is normal size. Probable right effusion.
IMPRESSION: Diffuse right lung airspace disease and left basilar opacity
concerning for pneumonia. Small right effusion.
